# Patient Record
Sex: Male | Born: 1985 | Race: Black or African American | Hispanic: No | Marital: Married | Smoking: Current some day smoker
Health system: Southern US, Community
[De-identification: ages and names within clinical notes are randomized; demographics above are authoritative.]

---

## 2016-05-18 ENCOUNTER — Emergency Department (HOSPITAL_COMMUNITY): Payer: Self-pay

## 2016-05-18 ENCOUNTER — Encounter (HOSPITAL_COMMUNITY): Payer: Self-pay

## 2016-05-18 ENCOUNTER — Emergency Department (HOSPITAL_COMMUNITY)
Admission: EM | Admit: 2016-05-18 | Discharge: 2016-05-18 | Disposition: A | Discharge status: Home / Self Care | Payer: Self-pay | Attending: Emergency Medicine | Admitting: Emergency Medicine

## 2016-05-18 DIAGNOSIS — F17210 Nicotine dependence, cigarettes, uncomplicated: Secondary | ICD-10-CM | POA: Insufficient documentation

## 2016-05-18 DIAGNOSIS — Y929 Unspecified place or not applicable: Secondary | ICD-10-CM | POA: Insufficient documentation

## 2016-05-18 DIAGNOSIS — Y939 Activity, unspecified: Secondary | ICD-10-CM | POA: Insufficient documentation

## 2016-05-18 DIAGNOSIS — S39012A Strain of muscle, fascia and tendon of lower back, initial encounter: Secondary | ICD-10-CM | POA: Insufficient documentation

## 2016-05-18 DIAGNOSIS — X58XXXA Exposure to other specified factors, initial encounter: Secondary | ICD-10-CM | POA: Insufficient documentation

## 2016-05-18 DIAGNOSIS — Y999 Unspecified external cause status: Secondary | ICD-10-CM | POA: Insufficient documentation

## 2016-05-18 MED ORDER — METHOCARBAMOL 500 MG PO TABS
500.0000 mg | ORAL_TABLET | Freq: Three times a day (TID) | ORAL | 0 refills | Status: DC
Start: 2016-05-18 — End: 2022-07-03

## 2016-05-18 MED ORDER — DICLOFENAC SODIUM 75 MG PO TBEC: 75.0000 mg | DELAYED_RELEASE_TABLET | Freq: Two times a day (BID) | ORAL | 0 refills | Status: DC

## 2016-05-18 MED ORDER — HYDROCODONE-ACETAMINOPHEN 5-325 MG PO TABS
2.0000 | ORAL_TABLET | Freq: Once | ORAL | Status: AC
  Administered 2016-05-18: 2 via ORAL
  Filled 2016-05-18: qty 2

## 2016-05-18 MED ORDER — KETOROLAC TROMETHAMINE 10 MG PO TABS
10.0000 mg | ORAL_TABLET | Freq: Once | ORAL | Status: AC
  Administered 2016-05-18: 10 mg via ORAL
  Filled 2016-05-18: qty 1

## 2016-05-18 MED ORDER — METHOCARBAMOL 500 MG PO TABS
1000.0000 mg | ORAL_TABLET | Freq: Once | ORAL | Status: AC
  Administered 2016-05-18: 1000 mg via ORAL
  Filled 2016-05-18: qty 2

## 2016-05-18 NOTE — Discharge Instructions (Signed)
YOur xray is negative for acute problem. Please apply heating pad to your back, or soak in warm bath. Please see Dr Romeo Apple for additional evaluation of your back. Use robaxin  for spasm pain. Use diclofenac daily with food.

## 2016-05-18 NOTE — ED Triage Notes (Signed)
Reports of lower back pain that radiates down bilateral legs x2 weeks. Denies injury, bowel or bladder issues.

## 2016-05-18 NOTE — ED Provider Notes (Signed)
[facility]EM[facility]Y DEPT Provider Note   CSN: [id number] Arrival date & time: 05/18/16  2117  First Provider Contact:  None       History   Chief Complaint Chief Complaint  Patient presents with  . Back Pain    HPI Lee Rice is a 30 y.o. male.  Patient is a 30 year old male who presents to the emergency department with a complaint of lower back pain.  The patient states that he's been having some discomfort over the past 2 weeks. The pain has been particularly worse in the past 2 days. Standing and lying down makes the pain worse. Sitting alleviates the pain slightly. The patient denies any recent injury or trauma to the back. 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 She's not had any difficulty with urination. He is not had any loss of control of bowels or bladder. His been no unusual nausea or vomiting. His been no blood in stool or problems with his stool.      History reviewed. No pertinent past medical history.  There are no active problems to display for this patient.   History reviewed. No pertinent surgical history.     Home Medications    Prior to Admission medications   Not on File    Family History No family history on file.  Social History Social History  Substance Use Topics  . Smoking status: Current Some Day Smoker    Types: Cigarettes  . Smokeless tobacco: Never Used  . Alcohol use No     Allergies   Review of patient's allergies indicates no known allergies.   Review of Systems Review of Systems  Musculoskeletal: Positive for back pain.  All other systems reviewed and are negative.    Physical Exam Updated Vital Signs BP 135/78   Pulse 83   Temp 97.7 F (36.5 C) (Oral)   Resp 18   Ht 6' (1.829 m)   Wt 106.6 kg   SpO2 98%   BMI 31.87 kg/m   Physical Exam  Constitutional: He is oriented to person, place, and time. He appears well-developed  and well-nourished.  Non-toxic appearance.  HENT:  Head: Normocephalic.  Right Ear: Tympanic membrane and external ear normal.  Left Ear: Tympanic membrane and external ear normal.  Eyes: EOM and lids are normal. Pupils are equal, round, and reactive to light.  Neck: Normal range of motion. Neck supple. Carotid bruit is not present.  Cardiovascular: Normal rate, regular rhythm, normal heart sounds, intact distal pulses and normal pulses.   Pulmonary/Chest: Breath sounds normal. No respiratory distress.  Abdominal: Soft. Bowel sounds are normal. There is no tenderness. There is no guarding.  Musculoskeletal:       Lumbar back: He exhibits decreased range of motion, pain and spasm. He exhibits no deformity.  Lymphadenopathy:       Head (right side): No submandibular adenopathy present.       Head (left side): No submandibular adenopathy present.    He has no cervical adenopathy.  Neurological: He is alert and oriented to person, place, and time. He has normal strength. He displays normal reflexes. No cranial nerve deficit or sensory deficit. Coordination normal.  Skin: Skin is warm and dry.  Psychiatric: He has a normal mood and affect. His speech is normal.  Nursing note and vitals reviewed.    ED Treatments / Results  Labs (all labs ordered are  listed, but only abnormal results are displayed) Labs Reviewed - No data to display  EKG  EKG Interpretation None       Radiology No results found.  Procedures Procedures (including critical care time)  Medications Ordered in ED Medications  methocarbamol (ROBAXIN) tablet 1,000 mg (not administered)  ketorolac (TORADOL) tablet 10 mg (not administered)  HYDROcodone-acetaminophen (NORCO/VICODIN) 5-325 MG per tablet 2 tablet (not administered)     Initial Impression / Assessment and Plan / ED Course  I have reviewed the triage vital signs and the nursing notes.  Pertinent labs & imaging results that were available during my  care of the patient were reviewed by me and considered in my medical decision making (see chart for details).  Clinical Course    **I have reviewed nursing notes, vital signs, and all appropriate lab and imaging results for this patient.*  Final Clinical Impressions(s) / ED Diagnoses  Vital signs stable. The Exam suggest lumbar strain. NO evidence for caudal equina or other emergent changes. Pt to be treated with muscle relaxant. Pt referred to orthopedics.   Final diagnoses:  Lumbar strain, initial encounter    New Prescriptions New Prescriptions   No medications on file     Ivery Quale, PA-C 05/21/16 2227    Lee Bale, MD 05/23/16 [phone]

## 2018-05-08 ENCOUNTER — Emergency Department
Admission: EM | Admit: 2018-05-08 | Discharge: 2018-05-09 | Disposition: A | Discharge status: Home / Self Care | Payer: Self-pay | Attending: Emergency Medicine | Admitting: Emergency Medicine

## 2018-05-08 ENCOUNTER — Encounter: Payer: Self-pay | Admitting: *Deleted

## 2018-05-08 ENCOUNTER — Emergency Department: Payer: Self-pay

## 2018-05-08 ENCOUNTER — Other Ambulatory Visit: Payer: Self-pay

## 2018-05-08 DIAGNOSIS — Z79899 Other long term (current) drug therapy: Secondary | ICD-10-CM | POA: Insufficient documentation

## 2018-05-08 DIAGNOSIS — F17210 Nicotine dependence, cigarettes, uncomplicated: Secondary | ICD-10-CM | POA: Insufficient documentation

## 2018-05-08 DIAGNOSIS — N200 Calculus of kidney: Secondary | ICD-10-CM

## 2018-05-08 LAB — URINALYSIS, COMPLETE (UACMP) WITH MICROSCOPIC
BILIRUBIN URINE: NEGATIVE
Glucose, UA: NEGATIVE mg/dL
KETONES UR: NEGATIVE mg/dL
LEUKOCYTES UA: NEGATIVE
Nitrite: NEGATIVE
PH: 8 (ref 5.0–8.0)
Protein, ur: 30 mg/dL — AB
SQUAMOUS EPITHELIAL / LPF: NONE SEEN (ref 0–5)
Specific Gravity, Urine: 1.02 (ref 1.005–1.030)
WBC, UA: NONE SEEN WBC/hpf (ref 0–5)

## 2018-05-08 LAB — CBC
HEMATOCRIT: 46.5 % (ref 40.0–52.0)
HEMOGLOBIN: 15.3 g/dL (ref 13.0–18.0)
MCH: 28.6 pg (ref 26.0–34.0)
MCHC: 32.9 g/dL (ref 32.0–36.0)
MCV: 87 fL (ref 80.0–100.0)
Platelets: 282 10*3/uL (ref 150–440)
RBC: 5.35 MIL/uL (ref 4.40–5.90)
RDW: 15.2 % — ABNORMAL HIGH (ref 11.5–14.5)
WBC: 9.2 10*3/uL (ref 3.8–10.6)

## 2018-05-08 LAB — COMPREHENSIVE METABOLIC PANEL
ALT: 51 U/L — ABNORMAL HIGH (ref 0–44)
ANION GAP: 8 (ref 5–15)
AST: 41 U/L (ref 15–41)
Albumin: 5 g/dL (ref 3.5–5.0)
Alkaline Phosphatase: 50 U/L (ref 38–126)
BUN: 14 mg/dL (ref 6–20)
CHLORIDE: 102 mmol/L (ref 98–111)
CO2: 29 mmol/L (ref 22–32)
Calcium: 9.4 mg/dL (ref 8.9–10.3)
Creatinine, Ser: 1.27 mg/dL — ABNORMAL HIGH (ref 0.61–1.24)
Glucose, Bld: 101 mg/dL — ABNORMAL HIGH (ref 70–99)
POTASSIUM: 3.5 mmol/L (ref 3.5–5.1)
Sodium: 139 mmol/L (ref 135–145)
Total Bilirubin: 0.8 mg/dL (ref 0.3–1.2)
Total Protein: 8.1 g/dL (ref 6.5–8.1)

## 2018-05-08 LAB — LIPASE, BLOOD: LIPASE: 33 U/L (ref 11–51)

## 2018-05-08 MED ORDER — TAMSULOSIN HCL 0.4 MG PO CAPS
0.4000 mg | ORAL_CAPSULE | Freq: Once | ORAL | Status: AC
  Administered 2018-05-09: 0.4 mg via ORAL
  Filled 2018-05-08: qty 1

## 2018-05-08 MED ORDER — ONDANSETRON HCL 4 MG/2ML IJ SOLN
4.0000 mg | Freq: Once | INTRAMUSCULAR | Status: AC
  Administered 2018-05-08: 4 mg via INTRAVENOUS

## 2018-05-08 MED ORDER — ONDANSETRON HCL 4 MG/2ML IJ SOLN
INTRAMUSCULAR | Status: AC
  Administered 2018-05-08: 4 mg via INTRAVENOUS
  Filled 2018-05-08: qty 2

## 2018-05-08 MED ORDER — MORPHINE SULFATE (PF) 4 MG/ML IV SOLN
4.0000 mg | Freq: Once | INTRAVENOUS | Status: AC
  Administered 2018-05-08: 4 mg via INTRAVENOUS

## 2018-05-08 MED ORDER — FENTANYL CITRATE (PF) 100 MCG/2ML IJ SOLN
50.0000 ug | INTRAMUSCULAR | Status: DC | PRN
  Administered 2018-05-08: 50 ug via INTRAVENOUS
  Filled 2018-05-08: qty 2

## 2018-05-08 MED ORDER — KETOROLAC TROMETHAMINE 30 MG/ML IJ SOLN
30.0000 mg | Freq: Once | INTRAMUSCULAR | Status: AC
  Administered 2018-05-09: 30 mg via INTRAVENOUS
  Filled 2018-05-08: qty 1

## 2018-05-08 MED ORDER — MORPHINE SULFATE (PF) 4 MG/ML IV SOLN
INTRAVENOUS | Status: AC
  Administered 2018-05-08: 4 mg via INTRAVENOUS
  Filled 2018-05-08: qty 1

## 2018-05-08 MED ORDER — SODIUM CHLORIDE 0.9 % IV BOLUS
1000.0000 mL | Freq: Once | INTRAVENOUS | Status: AC
  Administered 2018-05-08: 1000 mL via INTRAVENOUS

## 2018-05-08 NOTE — ED Triage Notes (Signed)
Pt reporting sudden onset of severe right flank and abd pain. No hx of kidney stones but pt is no longer able to urinate even though he feels the urge to void. Pt appears very uncomfortable in triage. No NVD. Pt is diaphoretic.

## 2018-05-08 NOTE — ED Provider Notes (Signed)
[facility] Emergency Department Provider Note    First MD Initiated Contact with Patient 05/08/18 2311     (approximate)  I have reviewed the triage vital signs and the nursing notes.   HISTORY  Chief Complaint Abdominal Pain and Flank Pain    HPI Lee Rice is a 32 y.o. male presents with 1 hour history of 8out 10 right flank pain that is nonradiating.  Patient states that urinary urgency however no voiding occurs.  Patient denies any fever no nausea vomiting diarrhea constipation.  Patient denies any abdominal pain.   Past medical history None There are no active problems to display for this patient.   Past surgical history None  Prior to Admission medications   Medication Sig Start Date End Date Taking? Authorizing Provider  diclofenac (VOLTAREN) 75 MG EC tablet Take 1 tablet (75 mg total) by mouth 2 (two) times daily. 05/18/16   Ivery QualeBryant, [place], PA-C  methocarbamol (ROBAXIN) 500 MG tablet Take 1 tablet (500 mg total) by mouth 3 (three) times daily. 05/18/16   Ivery QualeBryant, [place], PA-C  ondansetron (ZOFRAN ODT) 4 MG disintegrating tablet Take 1 tablet (4 mg total) by mouth every 8 (eight) hours as needed for nausea or vomiting. 05/09/18   Darci CurrentBrown, [place] N, MD  oxyCODONE-acetaminophen (PERCOCET) 5-325 MG tablet Take 1 tablet by mouth every 4 (four) hours as needed for severe pain. 05/09/18 05/09/19  Darci CurrentBrown, [place] N, MD  tamsulosin [facility](FLOMAX) 0.4 MG CAPS capsule Take 1 capsule (0.4 mg total) by mouth daily after breakfast. 05/09/18   Darci CurrentBrown, [place] N, MD    Allergies No known drug allergies  History reviewed. No pertinent family history.  Social History Social History   Tobacco Use  . Smoking status: Current Some Day Smoker    Types: Cigarettes  . Smokeless tobacco: Never Used  Substance Use Topics  . Alcohol use: No  . Drug use: No    Review of Systems Constitutional: No fever/chills Eyes: No visual changes. ENT: No sore  throat. Cardiovascular: Denies chest pain. Respiratory: Denies shortness of breath. Gastrointestinal: No abdominal pain.  No nausea, no vomiting.  No diarrhea.  No constipation. Genitourinary: Negative for dysuria. Musculoskeletal: Negative for neck pain.  Negative for back pain. Integumentary: Negative for rash. Neurological: Negative for headaches, focal weakness or numbness.  ____________________________________________   PHYSICAL EXAM:  VITAL SIGNS: ED Triage Vitals  Enc Vitals Group     BP 05/08/18 2249 (!) 150/79     Pulse Rate 05/08/18 2249 68     Resp 05/08/18 2249 16     Temp 05/08/18 2249 98.5 F (36.9 C)     Temp Source 05/08/18 2249 Oral     SpO2 05/08/18 2249 98 %     Weight 05/08/18 2249 115.7 kg (255 lb)     Height 05/08/18 2249 1.854 m (6\' 1" )     Head Circumference --      Peak Flow --      Pain Score 05/08/18 2250 8     Pain Loc --      Pain Edu? --      Excl. in GC? --     Constitutional: Alert and oriented. Apparent discomfort Eyes: Conjunctivae are normal.  Head: Atraumatic. Mouth/Throat: Mucous membranes are moist.  Oropharynx non-erythematous. Neck: No stridor.   Cardiovascular: Normal rate, regular rhythm. Good peripheral circulation. Grossly normal heart sounds. Respiratory: Normal respiratory effort.  No retractions. Lungs CTAB. Gastrointestinal: Soft and nontender. No distention.  Musculoskeletal: No lower extremity  tenderness nor edema. No gross deformities of extremities. Neurologic:  Normal speech and language. No gross focal neurologic deficits are appreciated.  Skin:  Skin is warm, dry and intact. No rash noted. Psychiatric: Mood and affect are normal. Speech and behavior are normal.  ____________________________________________   LABS (all labs ordered are listed, but only abnormal results are displayed)  Labs Reviewed  COMPREHENSIVE METABOLIC PANEL - Abnormal; Notable for the following components:      Result Value   Glucose,  Bld 101 (*)    Creatinine, Ser 1.27 (*)    ALT 51 (*)    All other components within normal limits  CBC - Abnormal; Notable for the following components:   RDW 15.2 (*)    All other components within normal limits  URINALYSIS, COMPLETE (UACMP) WITH MICROSCOPIC - Abnormal; Notable for the following components:   Color, Urine AMBER (*)    APPearance TURBID (*)    Hgb urine dipstick SMALL (*)    Protein, ur 30 (*)    Bacteria, UA RARE (*)    All other components within normal limits  LIPASE, BLOOD     RADIOLOGY I,  N BROWN, personally viewed and evaluated these images (plain radiographs) as part of my medical decision making, as well as reviewing the written report by the radiologist.  ED MD interpretation: State right UVJ stone noted by the radiologist on CT renal.  Official radiology report(s): Ct Renal Stone Study  Result Date: 05/08/2018 CLINICAL DATA:  32 year old male with right flank pain. EXAM: CT ABDOMEN AND PELVIS WITHOUT CONTRAST TECHNIQUE: Multidetector CT imaging of the abdomen and pelvis was performed following the standard protocol without IV contrast. COMPARISON:  None. FINDINGS: Evaluation of this exam is limited in the absence of intravenous contrast. Lower chest: The visualized lung bases are clear. No intra-abdominal free air or free fluid. Hepatobiliary: Diffuse fatty infiltration of the liver. No intrahepatic biliary ductal dilatation. The gallbladder is unremarkable. Pancreas: Unremarkable. No pancreatic ductal dilatation or surrounding inflammatory changes. Spleen: Normal in size without focal abnormality. Adrenals/Urinary Tract: The adrenal glands are unremarkable. There is a punctate distal right ureteral/right UVJ calculus with mild right hydronephrosis. The left kidney is unremarkable. The urinary bladder is predominantly collapsed. Stomach/Bowel: There is no bowel obstruction or active inflammation. Normal appendix. Vascular/Lymphatic: The abdominal aorta  and IVC are grossly unremarkable on this noncontrast CT. No portal venous gas. There is no adenopathy. Reproductive: The prostate and seminal vesicles are grossly unremarkable. No pelvic mass. Other: Small fat containing umbilical hernia. Musculoskeletal: No acute or significant osseous findings. IMPRESSION: A punctate right UVJ stone with mild right hydronephrosis. Electronically Signed   By: Elgie Collard M.D.   On: 05/08/2018 23:42      Procedures   ____________________________________________   INITIAL IMPRESSION / ASSESSMENT AND PLAN / ED COURSE  As part of my medical decision making, I reviewed the following data within the electronic MEDICAL RECORD NUMBER   32 year old male presenting with above-stated history and physical exam consistent with right ureterolithiasis which was confirmed by CT renal protocol.  Patient is a punctate right UVJ stone.  Patient was given IV morphine 4 mg in the emergency department without any improvement of pain and as such IV Dilaudid 1 mg was given.  After review of the CT scan the patient was given 30 mg of Toradol as well as Flomax.  Patient pain controlled at this time.     ____________________________________________  FINAL CLINICAL IMPRESSION(S) / ED DIAGNOSES  Final diagnoses:  Right kidney stone     MEDICATIONS GIVEN DURING THIS VISIT:  Medications  fentaNYL (SUBLIMAZE) injection 50 mcg (50 mcg Intravenous Given 05/08/18 2259)  sodium chloride 0.9 % bolus 1,000 mL (1,000 mLs Intravenous New Bag/Given 05/08/18 2322)  morphine 4 MG/ML injection 4 mg (4 mg Intravenous Given 05/08/18 2321)  ondansetron (ZOFRAN) injection 4 mg (4 mg Intravenous Given 05/08/18 2322)  ketorolac (TORADOL) 30 MG/ML injection 30 mg (30 mg Intravenous Given 05/09/18 0028)  tamsulosin (FLOMAX) capsule 0.4 mg (0.4 mg Oral Given 05/09/18 0032)  HYDROmorphone (DILAUDID) injection 1 mg (1 mg Intravenous Given 05/09/18 0028)     ED Discharge Orders        Ordered     oxyCODONE-acetaminophen (PERCOCET) 5-325 MG tablet  Every 4 hours PRN     05/09/18 0047    tamsulosin (FLOMAX) 0.4 MG CAPS capsule  Daily after breakfast     05/09/18 0047    ondansetron (ZOFRAN ODT) 4 MG disintegrating tablet  Every 8 hours PRN     05/09/18 0047       Note:  This document was prepared using Dragon voice recognition software and may include unintentional dictation errors.    Darci Current, MD 05/09/18 [phone]

## 2018-05-09 MED ORDER — OXYCODONE-ACETAMINOPHEN 5-325 MG PO TABS: 1.0000 | ORAL_TABLET | ORAL | 0 refills | Status: AC | PRN

## 2018-05-09 MED ORDER — TAMSULOSIN HCL 0.4 MG PO CAPS: 0.4000 mg | ORAL_CAPSULE | Freq: Every day | ORAL | 0 refills | Status: DC

## 2018-05-09 MED ORDER — ONDANSETRON 4 MG PO TBDP: 4.0000 mg | ORAL_TABLET | Freq: Three times a day (TID) | ORAL | 0 refills | Status: DC | PRN

## 2018-05-09 MED ORDER — HYDROMORPHONE HCL 1 MG/ML IJ SOLN
1.0000 mg | Freq: Once | INTRAMUSCULAR | Status: AC
  Administered 2018-05-09: 1 mg via INTRAVENOUS
  Filled 2018-05-09: qty 1

## 2019-04-28 IMAGING — CT CT RENAL STONE PROTOCOL
2 of 4 series · 16 of 46 positions shown, 18 images · non-contrast
Comparison: None.

CLINICAL DATA: 31-year-old male with right flank pain.

EXAM:
CT ABDOMEN AND PELVIS WITHOUT CONTRAST
TECHNIQUE: Multidetector CT imaging of the abdomen and pelvis was performed
following the standard protocol without IV contrast.

[Series 2: stone full standard · axial · 0.87mm/px · z∈[-425,+65]mm · 13 of 108 slices shown, 15 images]
[im 5/108  soft-tissue]
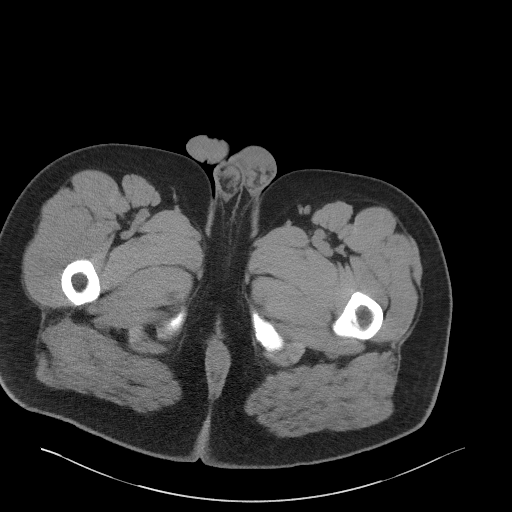
[im 5/108  bone]
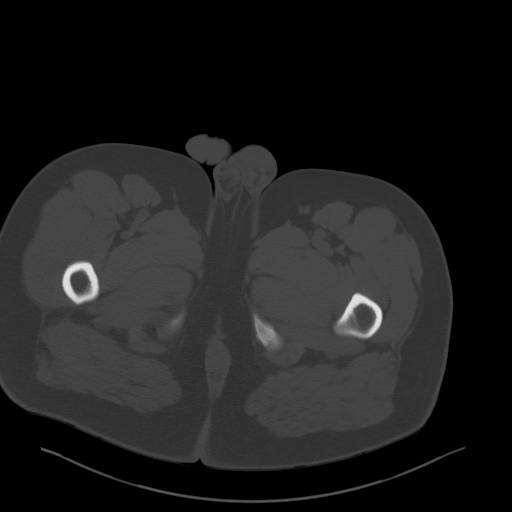
[im 13/108  soft-tissue]
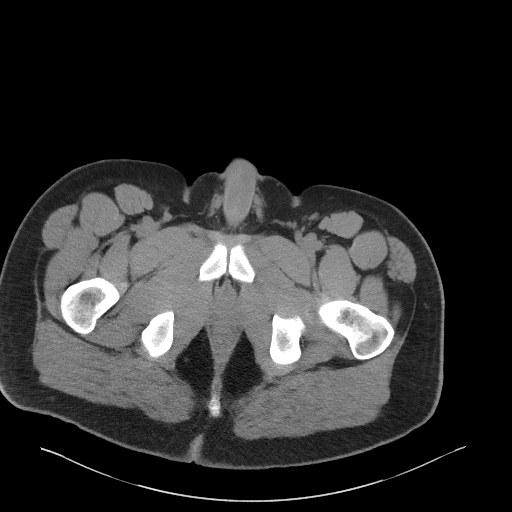
[im 22/108  soft-tissue]
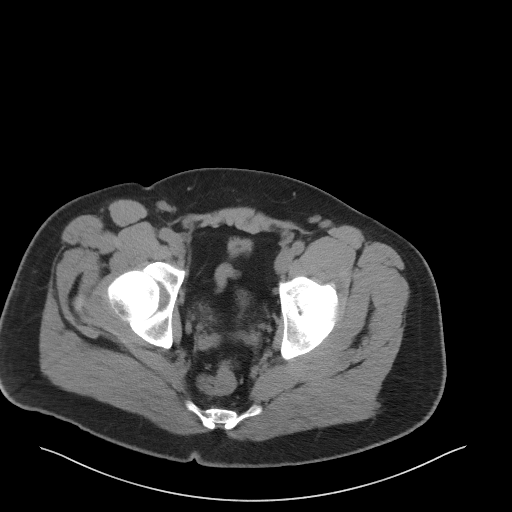
[im 30/108  soft-tissue]
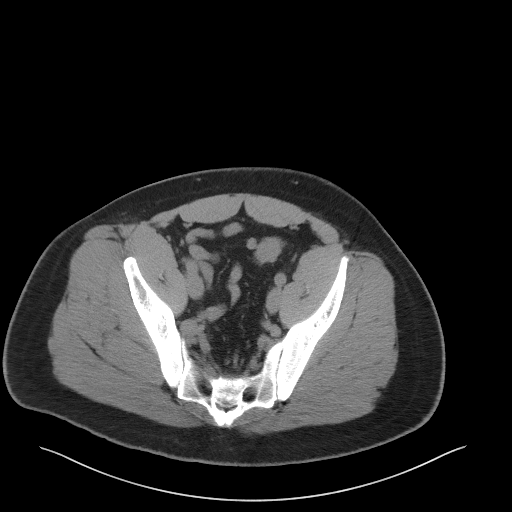
[im 39/108  soft-tissue]
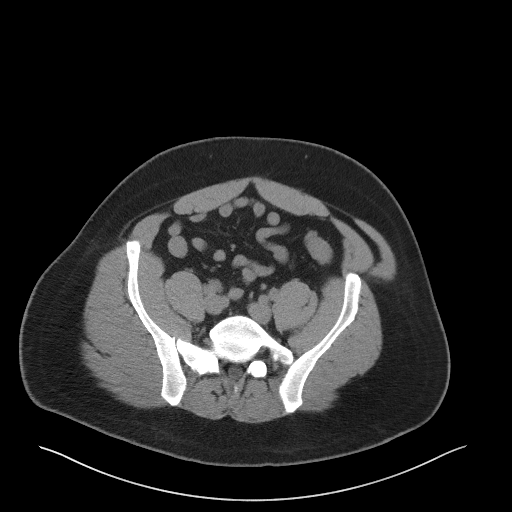
[im 48/108  soft-tissue]
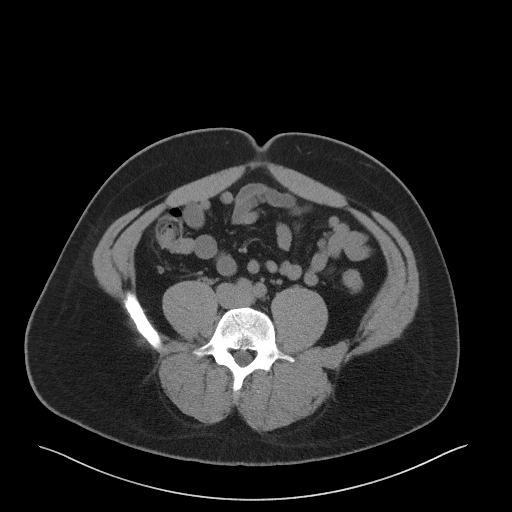
[im 56/108  soft-tissue]
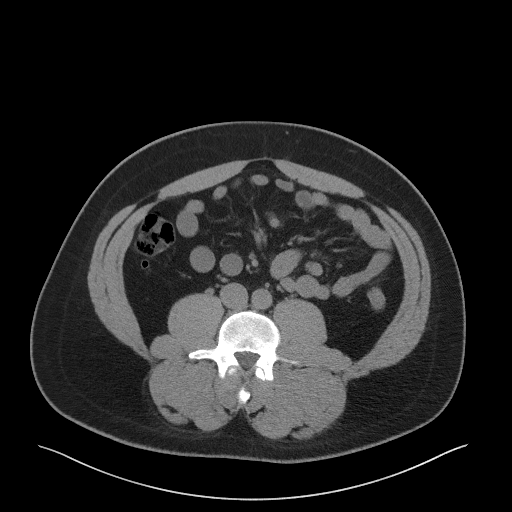
[im 60/108  soft-tissue]
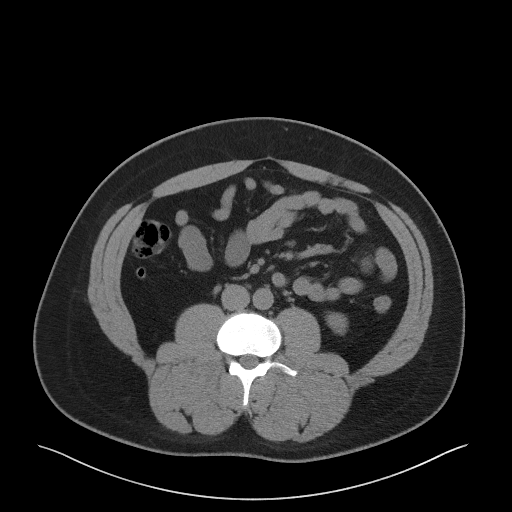
[im 69/108  soft-tissue]
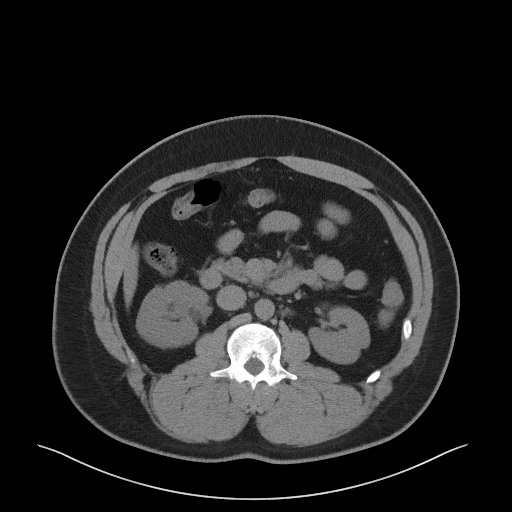
[im 69/108  bone]
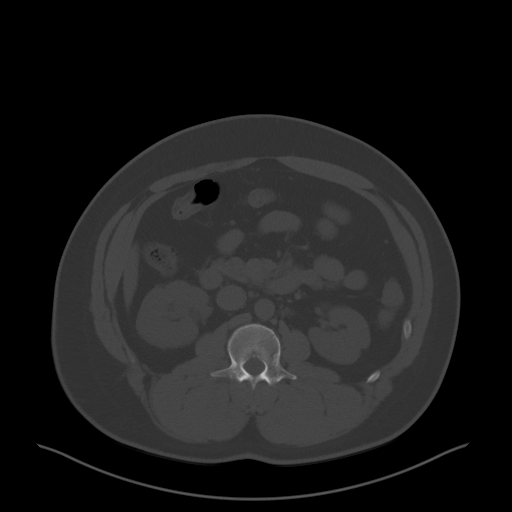
[im 78/108  soft-tissue]
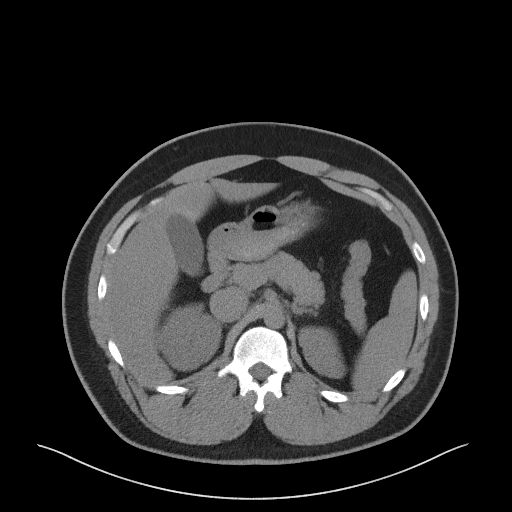
[im 86/108  soft-tissue]
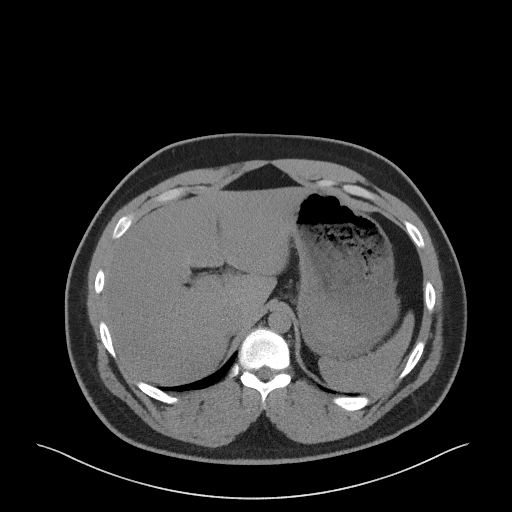
[im 95/108  soft-tissue]
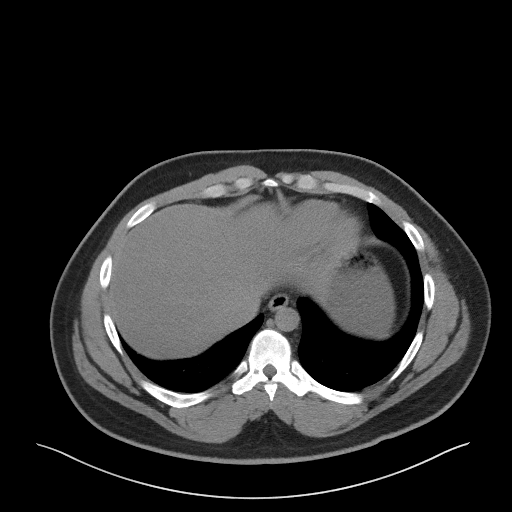
[im 103/108  soft-tissue]
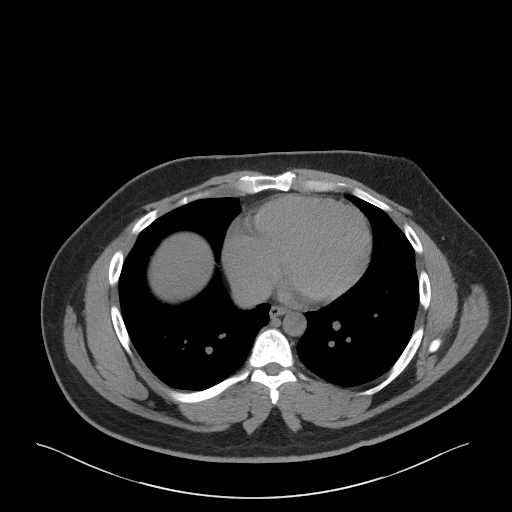

[Series 5: coronal · coronal · 0.82mm/px · 3 of 147 slices shown]
[im 49/147  soft-tissue]
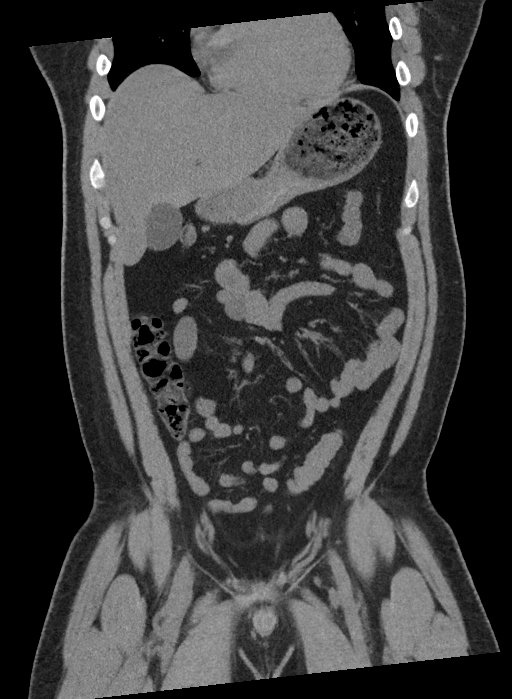
[im 65/147  soft-tissue]
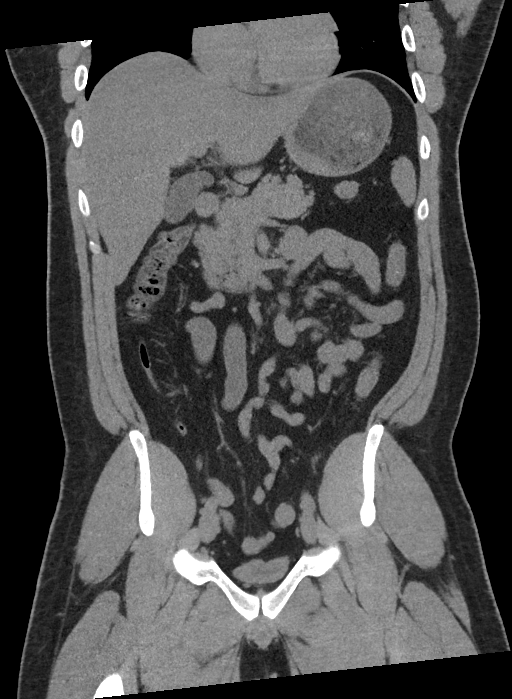
[im 82/147  soft-tissue]
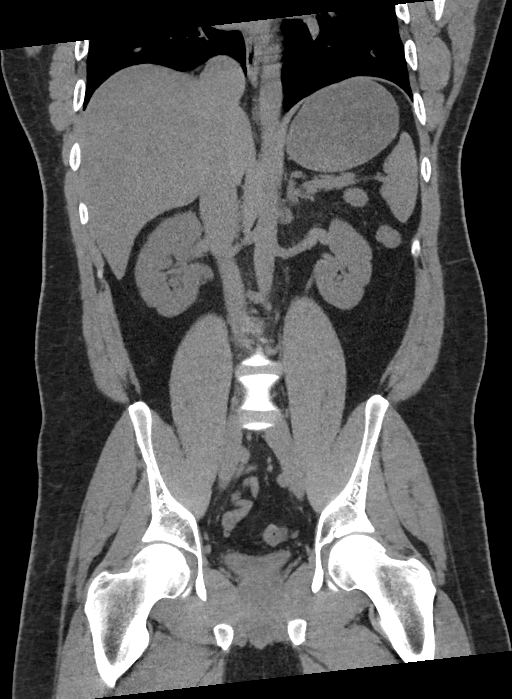

[16 of 46 positions shown; findings below may reference images not displayed]

FINDINGS: Evaluation of this exam is limited in the absence of intravenous
contrast.

Lower chest: The visualized lung bases are clear.

No intra-abdominal free air or free fluid.

Hepatobiliary: Diffuse fatty infiltration of the liver. No
intrahepatic biliary ductal dilatation. The gallbladder is
unremarkable.

Pancreas: Unremarkable. No pancreatic ductal dilatation or
surrounding inflammatory changes.

Spleen: Normal in size without focal abnormality.

Adrenals/Urinary Tract: The adrenal glands are unremarkable. There
is a punctate distal right ureteral/right UVJ calculus with mild
right hydronephrosis. The left kidney is unremarkable. The urinary
bladder is predominantly collapsed.

Stomach/Bowel: There is no bowel obstruction or active inflammation.
Normal appendix.

Vascular/Lymphatic: The abdominal aorta and IVC are grossly
unremarkable on this noncontrast CT. No portal venous gas. There is
no adenopathy.

Reproductive: The prostate and seminal vesicles are grossly
unremarkable. No pelvic mass.

Other: Small fat containing umbilical hernia.

Musculoskeletal: No acute or significant osseous findings.
IMPRESSION: A punctate right UVJ stone with mild right hydronephrosis.

## 2022-07-03 ENCOUNTER — Encounter: Payer: Self-pay | Admitting: Emergency Medicine

## 2022-07-03 ENCOUNTER — Emergency Department: Payer: 59

## 2022-07-03 ENCOUNTER — Other Ambulatory Visit: Payer: Self-pay

## 2022-07-03 ENCOUNTER — Emergency Department
Admission: EM | Admit: 2022-07-03 | Discharge: 2022-07-03 | Disposition: A | Discharge status: Home / Self Care | Payer: 59 | Attending: Emergency Medicine | Admitting: Emergency Medicine

## 2022-07-03 DIAGNOSIS — R319 Hematuria, unspecified: Secondary | ICD-10-CM | POA: Diagnosis present

## 2022-07-03 DIAGNOSIS — N200 Calculus of kidney: Secondary | ICD-10-CM | POA: Insufficient documentation

## 2022-07-03 LAB — BASIC METABOLIC PANEL
Anion gap: 9 (ref 5–15)
BUN: 14 mg/dL (ref 6–20)
CO2: 24 mmol/L (ref 22–32)
Calcium: 9.4 mg/dL (ref 8.9–10.3)
Chloride: 108 mmol/L (ref 98–111)
Creatinine, Ser: 0.99 mg/dL (ref 0.61–1.24)
GFR, Estimated: >60 mL/min (ref >60)
Glucose, Bld: 115 mg/dL — ABNORMAL HIGH (ref 70–99)
Potassium: 3.8 mmol/L (ref 3.5–5.1)
Sodium: 141 mmol/L (ref 135–145)

## 2022-07-03 LAB — URINALYSIS, ROUTINE W REFLEX MICROSCOPIC
Bilirubin Urine: NEGATIVE
Glucose, UA: NEGATIVE mg/dL
Ketones, ur: NEGATIVE mg/dL
Leukocytes,Ua: NEGATIVE
Nitrite: NEGATIVE
Protein, ur: 30 mg/dL — AB
RBC / HPF: >50 RBC/hpf — ABNORMAL HIGH (ref 0–5)
Specific Gravity, Urine: 1.026 (ref 1.005–1.030)
pH: 5 (ref 5.0–8.0)

## 2022-07-03 LAB — CBC
HCT: 47.2 % (ref 39.0–52.0)
Hemoglobin: 15.5 g/dL (ref 13.0–17.0)
MCH: 27.8 pg (ref 26.0–34.0)
MCHC: 32.8 g/dL (ref 30.0–36.0)
MCV: 84.7 fL (ref 80.0–100.0)
Platelets: 305 10*3/uL (ref 150–400)
RBC: 5.57 MIL/uL (ref 4.22–5.81)
RDW: 14.3 % (ref 11.5–15.5)
WBC: 7.9 10*3/uL (ref 4.0–10.5)
nRBC: 0 % (ref 0.0–0.2)

## 2022-07-03 MED ORDER — KETOROLAC TROMETHAMINE 30 MG/ML IJ SOLN
30.0000 mg | Freq: Once | INTRAMUSCULAR | Status: AC
  Administered 2022-07-03: 30 mg via INTRAMUSCULAR
  Filled 2022-07-03: qty 1

## 2022-07-03 MED ORDER — KETOROLAC TROMETHAMINE 10 MG PO TABS: 10.0000 mg | ORAL_TABLET | Freq: Four times a day (QID) | ORAL | 0 refills | Status: AC | PRN

## 2022-07-03 MED ORDER — TAMSULOSIN HCL 0.4 MG PO CAPS: 0.4000 mg | ORAL_CAPSULE | Freq: Every day | ORAL | 0 refills | Status: AC

## 2022-07-03 NOTE — ED Triage Notes (Signed)
C/O hematuria, decreased urinary flow x 3 days.  Seen by Urgent Care today, who referred patient to ED to be evaluated for possible kidney stone.

## 2022-07-03 NOTE — Discharge Instructions (Signed)
Follow-up with your regular doctor as needed.  Follow-up with urology if you have not passed the stone within 1 week.  Drink plenty of water.  Take medications as prescribed.  Return if worsening

## 2022-07-03 NOTE — ED Provider Notes (Signed)
[facility] Provider Note    Event Date/Time   First MD Initiated Contact with Patient 07/03/22 1425     (approximate)   History   Hematuria   HPI  Lee Rice is a 36 y.o. male with history of kidney stones presents emergency department complaining of decreased urinary flow for 3 days.  Hematuria.  Patient states he went to urgent care and they said he had too much blood in his urine.  No fever or chills, no vomiting or diarrhea      Physical Exam   Triage Vital Signs: ED Triage Vitals  Enc Vitals Group     BP 07/03/22 1308 (!) 147/97     Pulse Rate 07/03/22 1308 61     Resp 07/03/22 1308 17     Temp 07/03/22 1308 98.6 F (37 C)     Temp Source 07/03/22 1308 Oral     SpO2 07/03/22 1308 98 %     Weight 07/03/22 1257 255 lb 1.2 oz (115.7 kg)     Height 07/03/22 1257 6\' 1"  (1.854 m)     Head Circumference --      Peak Flow --      Pain Score 07/03/22 1257 0     Pain Loc --      Pain Edu? --      Excl. in GC? --     Most recent vital signs: Vitals:   07/03/22 1308  BP: (!) 147/97  Pulse: 61  Resp: 17  Temp: 98.6 F (37 C)  SpO2: 98%     General: Awake, no distress.   CV:  Good peripheral perfusion. regular rate and  rhythm Resp:  Normal effort. Lungs c t a  Abd:  No distention.  No CVA tenderness, abdomen is nontender Other:      ED Results / Procedures / Treatments   Labs (all labs ordered are listed, but only abnormal results are displayed) Labs Reviewed  URINALYSIS, ROUTINE W REFLEX MICROSCOPIC - Abnormal; Notable for the following components:      Result Value   Color, Urine YELLOW (*)    APPearance HAZY (*)    Hgb urine dipstick LARGE (*)    Protein, ur 30 (*)    RBC / HPF >50 (*)    Bacteria, UA FEW (*)    All other components within normal limits  BASIC METABOLIC PANEL - Abnormal; Notable for the following components:   Glucose, Bld 115 (*)    All other components within normal limits  CBC      EKG     RADIOLOGY ED renal stone    PROCEDURES:   Procedures   MEDICATIONS ORDERED IN ED: Medications  ketorolac (TORADOL) 30 MG/ML injection 30 mg (30 mg Intramuscular Given 07/03/22 1516)     IMPRESSION / MDM / ASSESSMENT AND PLAN / ED COURSE  I reviewed the triage vital signs and the nursing notes.                              Differential diagnosis includes, but is not limited to, kidney stone, hematuria, bladder Ca  Patient's presentation is most consistent with acute complicated illness / injury requiring diagnostic workup.   Labs other than the urinalysis are reassuring, UA shows greater than 50 RBCs, large amount of hemoglobin and few bacteria  CT renal stone study ordered  Ct shows 50mm stone right uvj; this was independently reviewed and  interpreted by me.  I did explain findings to the patient.  He was given Toradol 30 mg IM while here in the ED.  He is encouraged to drink water.  He was given a prescription for Toradol and Flomax.  Follow-up with urology if not improving to 3 days.  He was given a strainer to catch the stone.  Patient is in agreement treatment plan.  He was discharged stable condition.      FINAL CLINICAL IMPRESSION(S) / ED DIAGNOSES   Final diagnoses:  Kidney stone     Rx / DC Orders   ED Discharge Orders          Ordered    ketorolac (TORADOL) 10 MG tablet  Every 6 hours PRN       Note to Pharmacy: Loading dose given in ER   07/03/22 1505    tamsulosin (FLOMAX) 0.4 MG CAPS capsule  Daily        07/03/22 1505             Note:  This document was prepared using Dragon voice recognition software and may include unintentional dictation errors.    Faythe Ghee, PA-C 07/03/22 1526    Chesley Noon, MD 07/04/22 [phone]

## 2022-07-04 ENCOUNTER — Telehealth: Payer: Self-pay | Admitting: Urology

## 2022-07-04 NOTE — Telephone Encounter (Signed)
Called pt per ED referral for kidney stones, pt said he was given meds and did not want to schedule appt at this time.
